# Patient Record
Sex: Female | Born: 1985 | Race: White | Hispanic: No | Marital: Married | Smoking: Never smoker
Health system: Southern US, Community
[De-identification: ages and names within clinical notes are randomized; demographics above are authoritative.]

## PROBLEM LIST (undated history)

## (undated) HISTORY — PX: APPENDECTOMY: SHX54

---

## 2018-04-22 ENCOUNTER — Other Ambulatory Visit: Payer: Self-pay | Admitting: General Surgery

## 2018-04-22 ENCOUNTER — Ambulatory Visit
Admission: RE | Admit: 2018-04-22 | Discharge: 2018-04-22 | Disposition: A | Discharge status: Home / Self Care | Payer: Self-pay | Source: Ambulatory Visit | Attending: General Surgery | Admitting: General Surgery

## 2018-04-22 ENCOUNTER — Other Ambulatory Visit: Payer: Self-pay | Admitting: Obstetrics and Gynecology

## 2018-04-22 ENCOUNTER — Other Ambulatory Visit: Payer: Self-pay

## 2018-04-22 DIAGNOSIS — N63 Unspecified lump in unspecified breast: Secondary | ICD-10-CM

## 2018-04-23 ENCOUNTER — Other Ambulatory Visit: Payer: No Typology Code available for payment source

## 2018-04-24 ENCOUNTER — Ambulatory Visit
Admission: RE | Admit: 2018-04-24 | Discharge: 2018-04-24 | Disposition: A | Discharge status: Home / Self Care | Payer: Self-pay | Source: Ambulatory Visit | Attending: Obstetrics and Gynecology | Admitting: Obstetrics and Gynecology

## 2018-04-24 ENCOUNTER — Encounter (HOSPITAL_COMMUNITY): Payer: Self-pay

## 2018-04-24 ENCOUNTER — Other Ambulatory Visit: Payer: Self-pay | Admitting: Obstetrics and Gynecology

## 2018-04-24 ENCOUNTER — Ambulatory Visit (HOSPITAL_COMMUNITY)
Admission: RE | Admit: 2018-04-24 | Discharge: 2018-04-24 | Disposition: A | Discharge status: Home / Self Care | Payer: Self-pay | Source: Ambulatory Visit | Attending: Obstetrics and Gynecology | Admitting: Obstetrics and Gynecology

## 2018-04-24 ENCOUNTER — Other Ambulatory Visit (HOSPITAL_COMMUNITY): Payer: Self-pay | Admitting: *Deleted

## 2018-04-24 VITALS — BP 110/68 | Wt 129.0 lb

## 2018-04-24 DIAGNOSIS — Z1239 Encounter for other screening for malignant neoplasm of breast: Secondary | ICD-10-CM

## 2018-04-24 DIAGNOSIS — N6315 Unspecified lump in the right breast, overlapping quadrants: Secondary | ICD-10-CM

## 2018-04-24 DIAGNOSIS — N63 Unspecified lump in unspecified breast: Secondary | ICD-10-CM

## 2018-04-24 DIAGNOSIS — N6320 Unspecified lump in the left breast, unspecified quadrant: Secondary | ICD-10-CM

## 2018-04-24 DIAGNOSIS — N6310 Unspecified lump in the right breast, unspecified quadrant: Secondary | ICD-10-CM

## 2018-04-24 NOTE — Progress Notes (Addendum)
Complaints of a left breast lump since December 13, 2017 that has increased in size, reddened, and painful. Patient stated she has been treated with four different antibiotics.   Pap Smear: Pap smear not completed today. Last Pap smear was in June 2018 at [facility] and normal per patient. Per patient has no history of an abnormal Pap smear. No Pap smear results are in Epic.  Physical exam: Breasts Breasts symmetrical. No skin abnormalities right breast. Skin is bruised within the left breast between 9 o'clock and 1 o'clock. A reddened area observed on the right breast between 8 o'clock and 9 o'clock. No nipple retraction bilateral breasts. No nipple discharge bilateral breasts. No lymphadenopathy. Palpated a lump within the left breast between 8 o'clock and 1 o'clock. Palpated a pea sized lump within the right breast at 12 o'clock 4 cm from the nipple. Complaints of pain when palpated the lump within the left breast. Referred patient to the [facility] of [facility] for a diagnostic mammogram and bilateral breast ultrasound. Appointment scheduled for Thursday, April 24, 2018 at 1100.        Pelvic/Bimanual No Pap smear completed today since last Pap smear was in June 2018 per patient. Pap smear not indicated per [facility] guidelines.   Smoking History: Patient has never smoked.  Patient Navigation: Patient education provided. Access to services provided for patient through [facility] program.   Breast and Cervical Cancer Risk Assessment: Patient has no family history of breast cancer, known genetic mutations, or radiation treatment to the chest before age 7. Patient has no history of cervical dysplasia, immunocompromised, or DES exposure in-utero. Breast Cancer risk assessment completed. No breast cancer risk calculated due to patient is less than 42 years old.

## 2018-04-24 NOTE — Patient Instructions (Signed)
Explained breast self awareness with Karen Morse. Patient did not need a Pap smear today due to last Pap smear was in 2018 per patient. Let her know [facility] will cover Pap smears every 3 years unless has a history of abnormal Pap smears. Referred patient to the [facility] of [facility] for a diagnostic mammogram and bilateral breast ultrasound. Appointment scheduled for Thursday, April 24, 2018 at 1100. Patient aware of appointment and will be there. Karen Morse verbalized understanding.  Debora Stockdale, Kathaleen Maser, RN 12:36 PM

## 2018-04-25 ENCOUNTER — Encounter (HOSPITAL_COMMUNITY): Payer: Self-pay | Admitting: *Deleted

## 2018-04-26 LAB — ACID FAST SMEAR (AFB, MYCOBACTERIA): Acid Fast Smear: NEGATIVE

## 2018-04-26 LAB — ACID FAST SMEAR (AFB)

## 2018-04-29 LAB — AEROBIC/ANAEROBIC CULTURE W GRAM STAIN (SURGICAL/DEEP WOUND)
Culture: NO GROWTH
Special Requests: NORMAL

## 2018-05-28 LAB — FUNGAL ORGANISM REFLEX

## 2018-05-28 LAB — FUNGUS CULTURE WITH STAIN

## 2018-05-28 LAB — FUNGUS CULTURE RESULT

## 2018-06-03 ENCOUNTER — Telehealth (HOSPITAL_COMMUNITY): Payer: Self-pay | Admitting: *Deleted

## 2018-06-03 NOTE — Telephone Encounter (Signed)
Patient called about receiving bills. Patient is going to mail copy of bills to [employer].

## 2018-06-08 LAB — ACID FAST CULTURE WITH REFLEXED SENSITIVITIES (MYCOBACTERIA)

## 2018-06-08 LAB — ACID FAST CULTURE WITH REFLEXED SENSITIVITIES: ACID FAST CULTURE - AFSCU3: NEGATIVE

## 2018-10-06 ENCOUNTER — Other Ambulatory Visit: Payer: Self-pay | Admitting: Surgery

## 2018-10-06 DIAGNOSIS — N61 Mastitis without abscess: Secondary | ICD-10-CM

## 2020-09-22 IMAGING — US ULTRASOUND LEFT BREAST LIMITED
1 series · 12 of 12 positions shown · non-contrast
Comparison: Previous exam(s).

CLINICAL DATA: Patient with LEFT breast abscess collection
identified at an outside institution in Wednesday January, 2018, with
presents today for additional diagnostic evaluation and
ultrasound-guided biopsy to ensure benignity.

Additional lump within upper outer quadrant of the RIGHT breast.
EXAM:
DIGITAL DIAGNOSTIC BILATERAL MAMMOGRAM WITH CAD AND TOMO
ULTRASOUND BILATERAL BREAST

[Series 1: ultrasound left breast limited · 0.07mm/px · 12 of 12 slices shown]
[im 1/12]
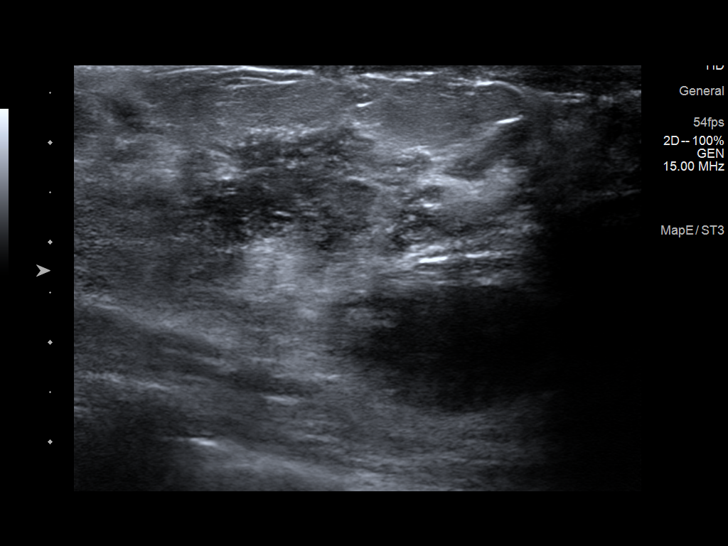
[im 2/12]
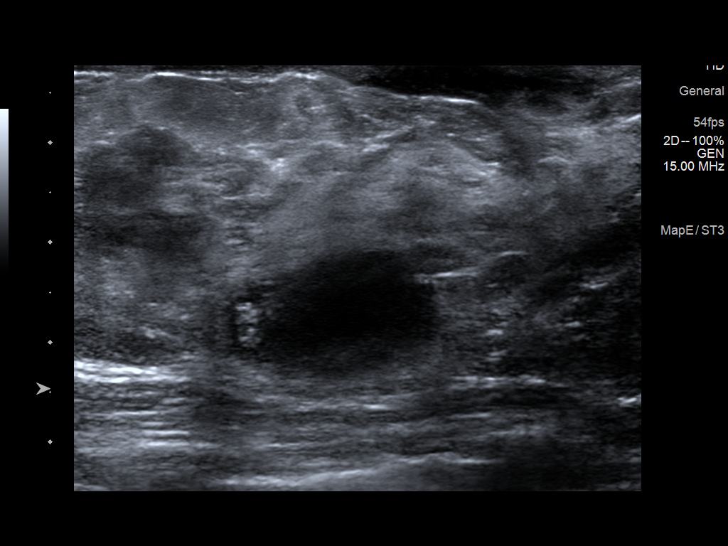
[im 3/12]
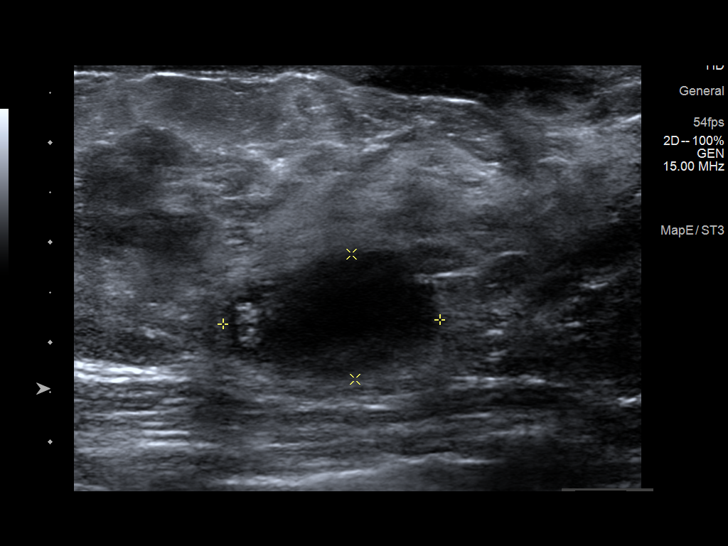
[im 4/12]
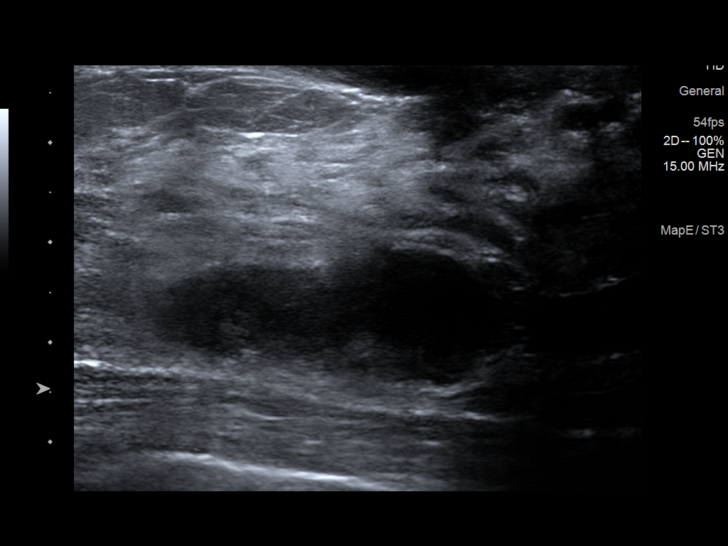
[im 5/12]
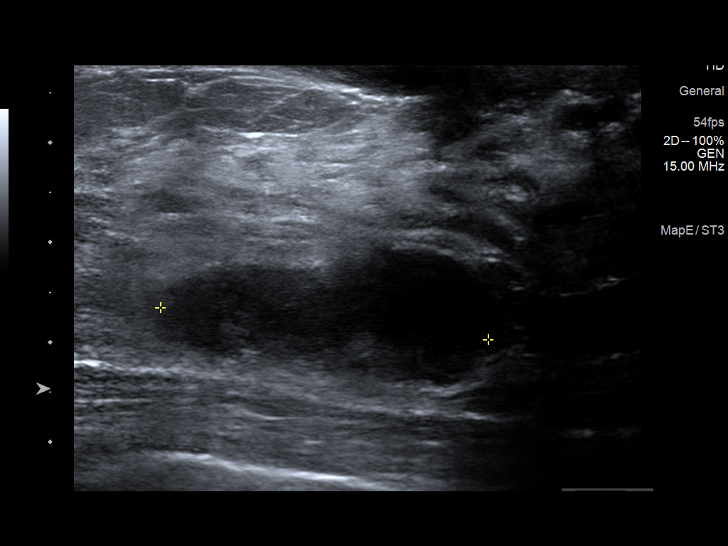
[im 6/12]
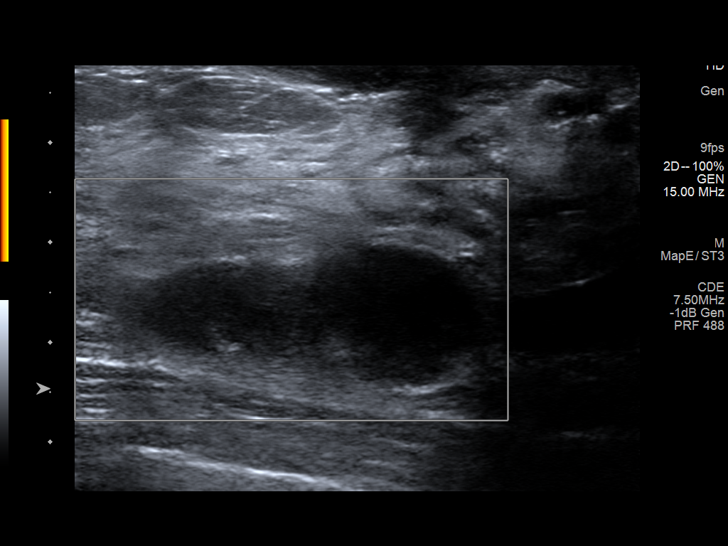
[im 7/12]
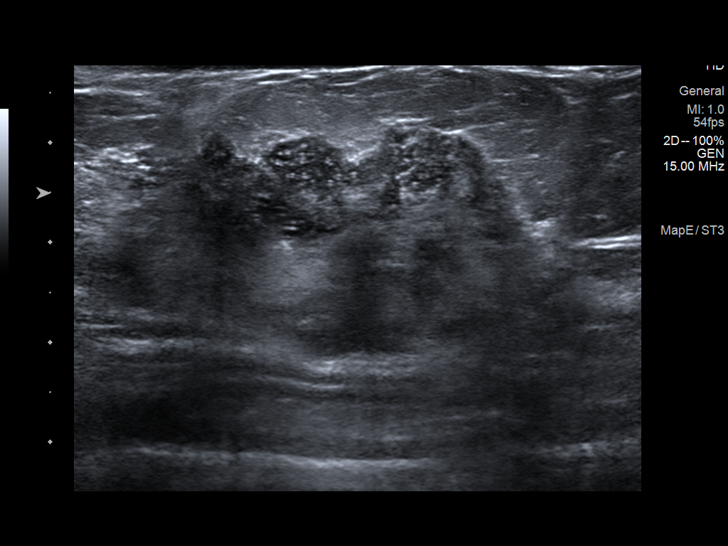
[im 8/12]
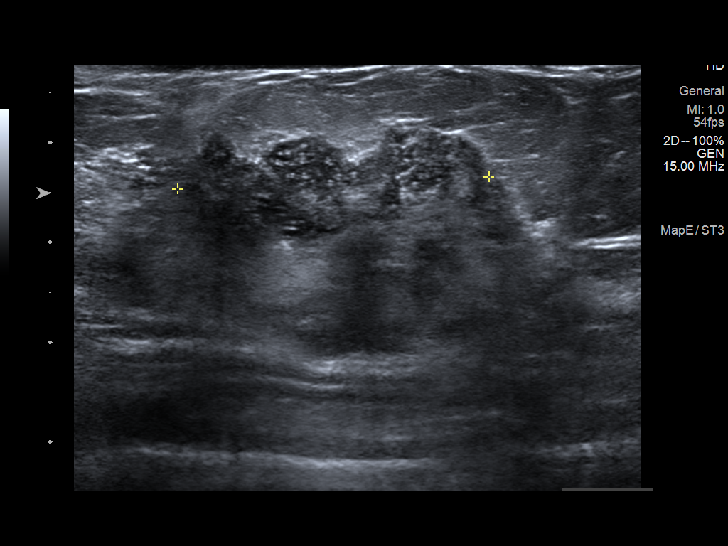
[im 9/12]
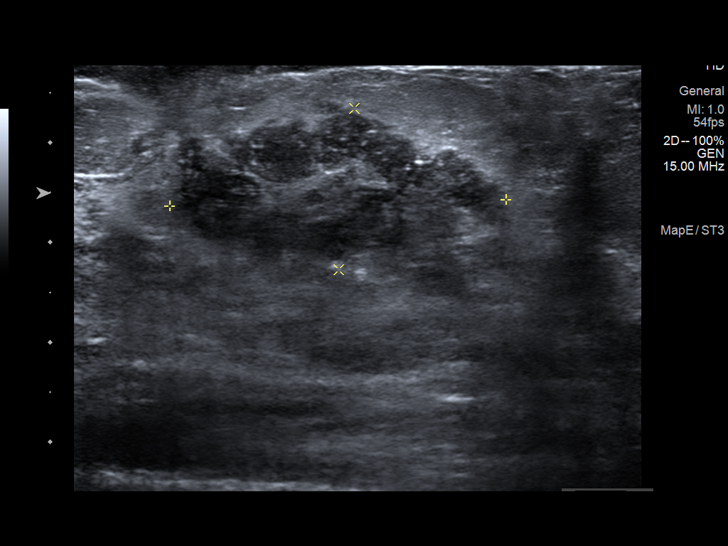
[im 10/12]
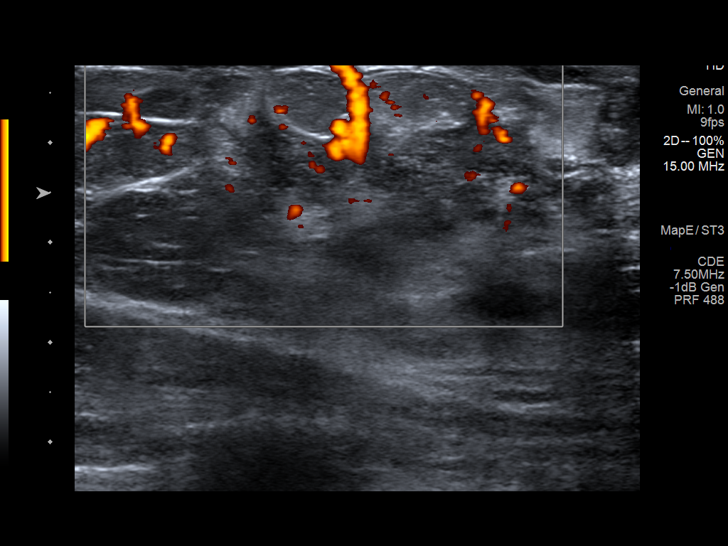
[im 11/12]
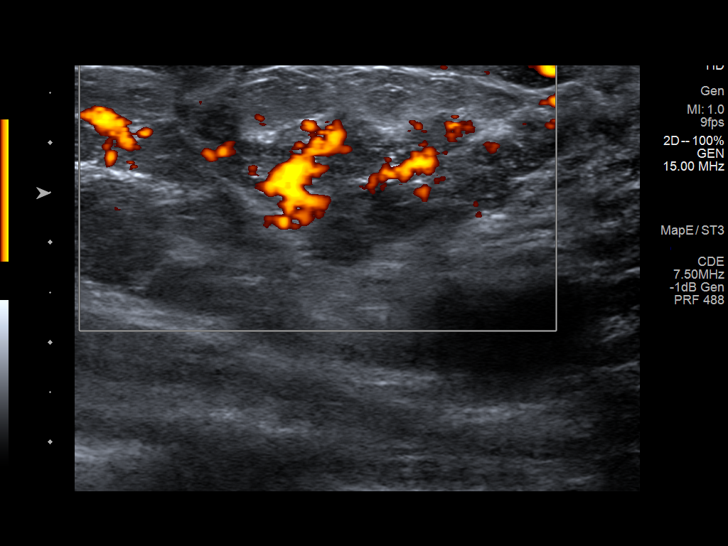
[im 12/12]
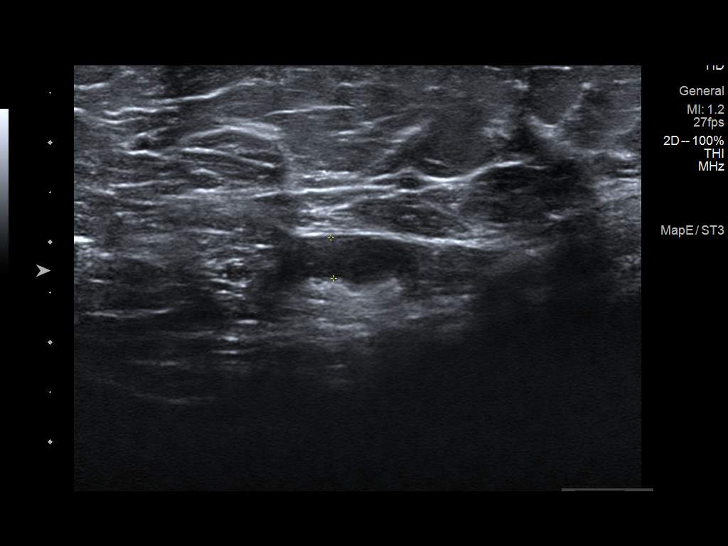

[12 of 12 positions shown; findings below may reference images not displayed]

ACR Breast Density Category c: The breast tissue is heterogeneously
dense, which may obscure small masses.
FINDINGS: RIGHT breast: There is a partially obscured low-density mass within
the upper outer quadrant of the RIGHT breast, measuring
approximately 1.3 cm greatest dimension.

No additional masses, suspicious calcifications or secondary signs
of malignancy within the RIGHT breast.

LEFT breast: There is an irregular mass within the slightly inner
LEFT breast, at posterior depth, measuring approximately 4 cm
greatest dimension, corresponding to the area of clinical concern
with overlying skin marker in place.

Questionable additional low-density mass within the retroareolar
LEFT breast, at superficial depth, measuring approximately 2 cm
greatest dimension, also corresponding to the area of clinical
concern with overlying skin marker in place.

Mammographic images were processed with CAD.

RIGHT breast: Targeted ultrasound is performed, showing an oval
circumscribed hypoechoic mass at the 11 o'clock axis, 4 cm from
nipple, measuring 1.3 x 0.6 x 1.2 cm, most likely a benign
fibroadenoma.

LEFT breast: There is a complex fluid collection within the LEFT
breast at the 9 o'clock axis, retroareolar, at posterior depth,
avascular, measuring 3.3 x 1.3 x 2.2 cm.

There is an additional ill-defined hypoechoic area within the LEFT
breast at the 11:30 o'clock axis, 3 cm from nipple, measuring 3.4 x
1.6 x 3.1 cm, hypervascular, corresponding to the site of a palpable
lump and overlying skin changes/recent drainage.
IMPRESSION: 1. Complex fluid collection within the LEFT breast at the 9 o'clock
axis, retroareolar, at posterior depth, measuring 3.3 cm, presumed
abscess collection. Ultrasound-guided drainage will be attempted
later today.
2. Ill-defined hypoechoic area within the LEFT breast at the 11:30
o'clock axis, 3 cm from nipple, measuring 3.4 cm greatest extent.
Ultrasound-guided biopsy will be performed later today. This may
represent granulomatous mastitis. The complex fluid collection at
posterior depth may represent superimposed abscess.
3. Oval circumscribed hypoechoic mass within the RIGHT breast at the
11 o'clock axis, 4 cm from the nipple, measuring 1.3 cm, most
suggestive of a benign fibroadenoma. Recommend follow-up ultrasound
in 6 months to ensure stability.

RECOMMENDATION:
1. Ultrasound-guided drainage will be attempted later today for the
presumed abscess collection within the LEFT breast at the 9 o'clock
axis, retroareolar, at posterior depth, measuring 3.3 cm.
2. Ultrasound-guided biopsy will be performed later today for the
hypoechoic area within the LEFT breast at the 11:30 o'clock axis,
measuring 3.4 cm greatest extent. This is suspected infection,
possible granulomatous mastitis.
3. If biopsy results are benign, recommend follow-up RIGHT breast
ultrasound in 6 months to ensure stability of the probably benign
fibroadenoma in the RIGHT breast at the 11 o'clock axis.

I have discussed the findings and recommendations with the patient.
Results were also provided in writing at the conclusion of the
visit. If applicable, a reminder letter will be sent to the patient
regarding the next appointment.

BI-RADS CATEGORY  3: Probably benign.

## 2020-09-22 IMAGING — MG DIGITAL DIAGNOSTIC BILATERAL MAMMOGRAM WITH TOMO AND CAD
6 of 12 series · 6 of 36 positions shown · non-contrast
Comparison: Previous exam(s).

CLINICAL DATA: Patient with LEFT breast abscess collection
identified at an outside institution in Wednesday January, 2018, with
presents today for additional diagnostic evaluation and
ultrasound-guided biopsy to ensure benignity.

Additional lump within upper outer quadrant of the RIGHT breast.
EXAM:
DIGITAL DIAGNOSTIC BILATERAL MAMMOGRAM WITH CAD AND TOMO
ULTRASOUND BILATERAL BREAST

[L MLO synth-2D (1 of 2)]
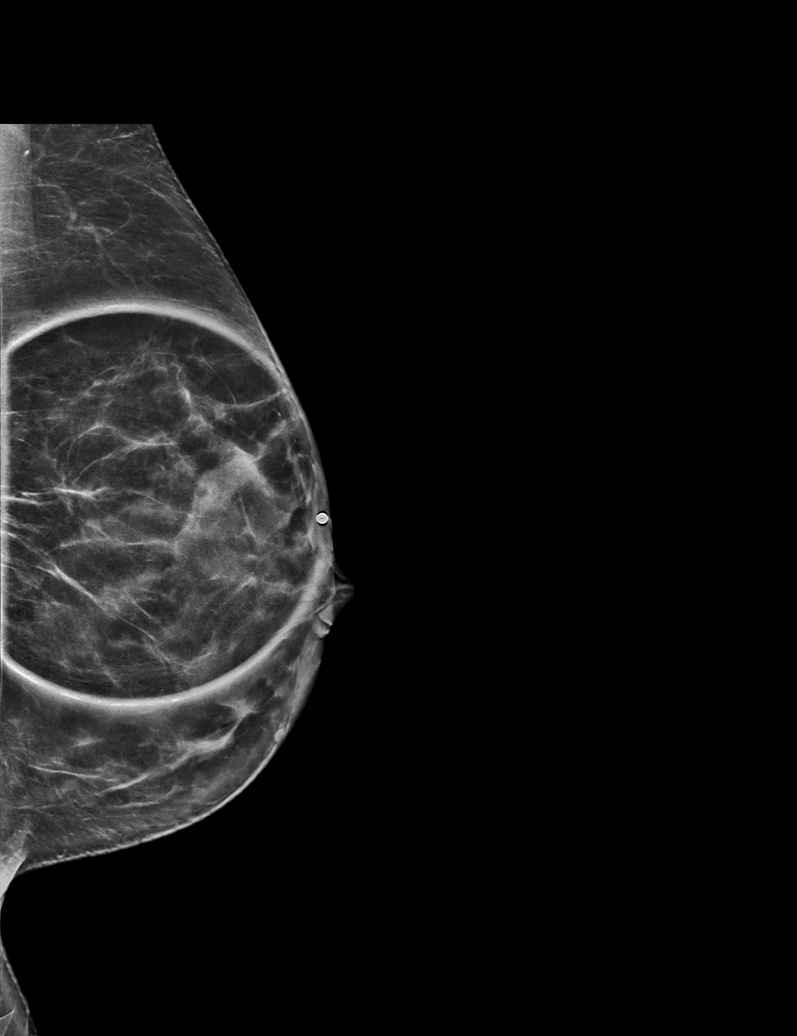

[L MLO synth-2D (2 of 2)]
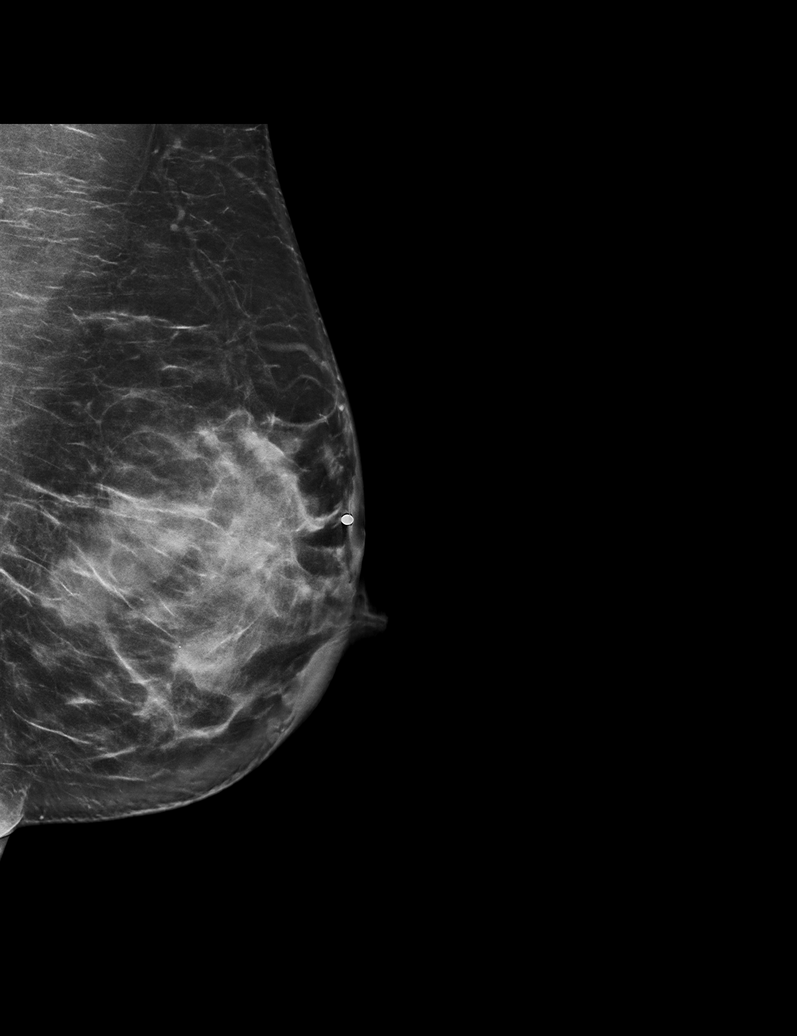

[L CC synth-2D]
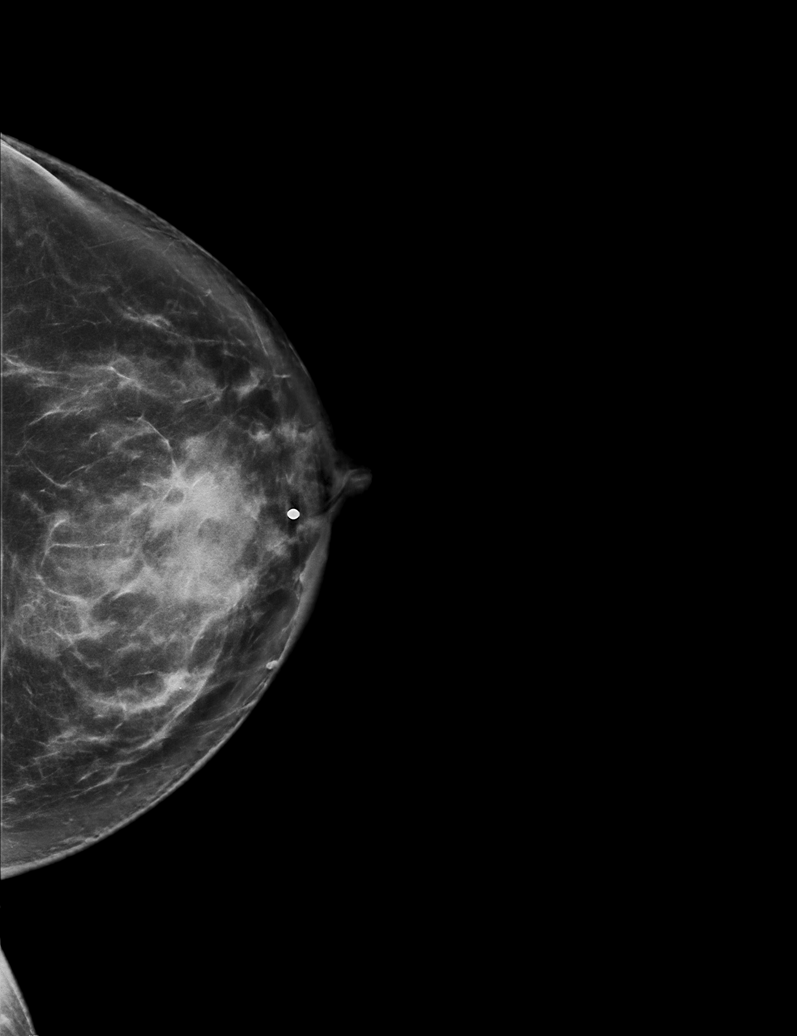

[R MLO synth-2D (1 of 2)]
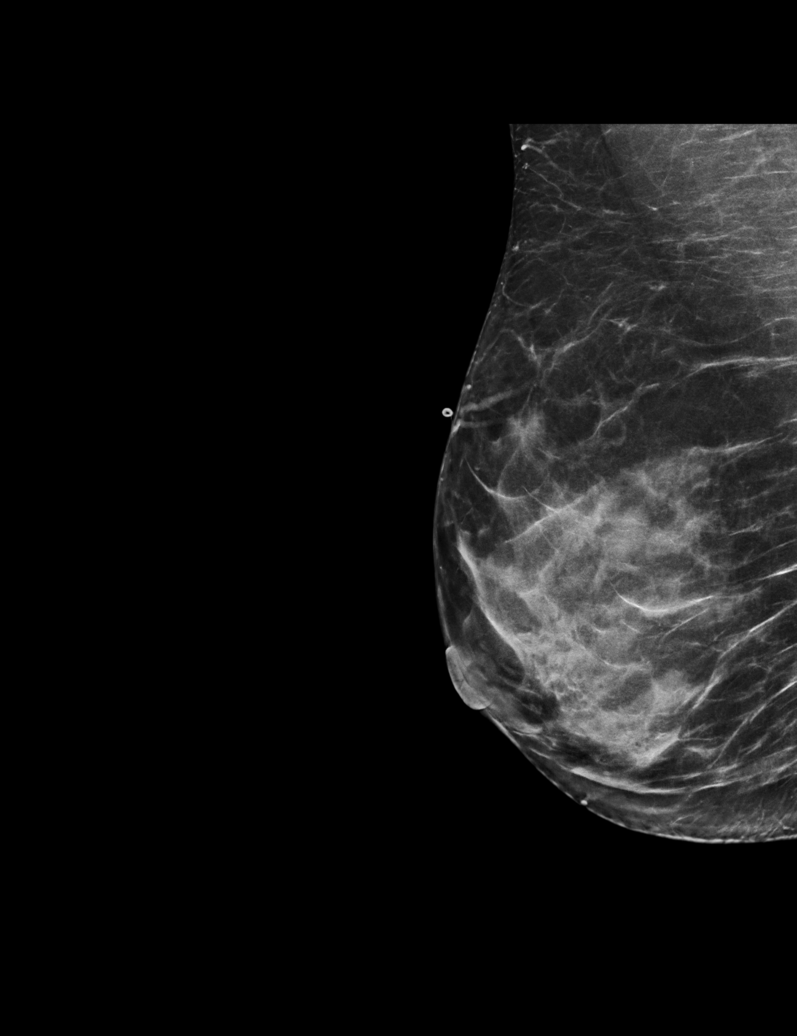

[R CC synth-2D]
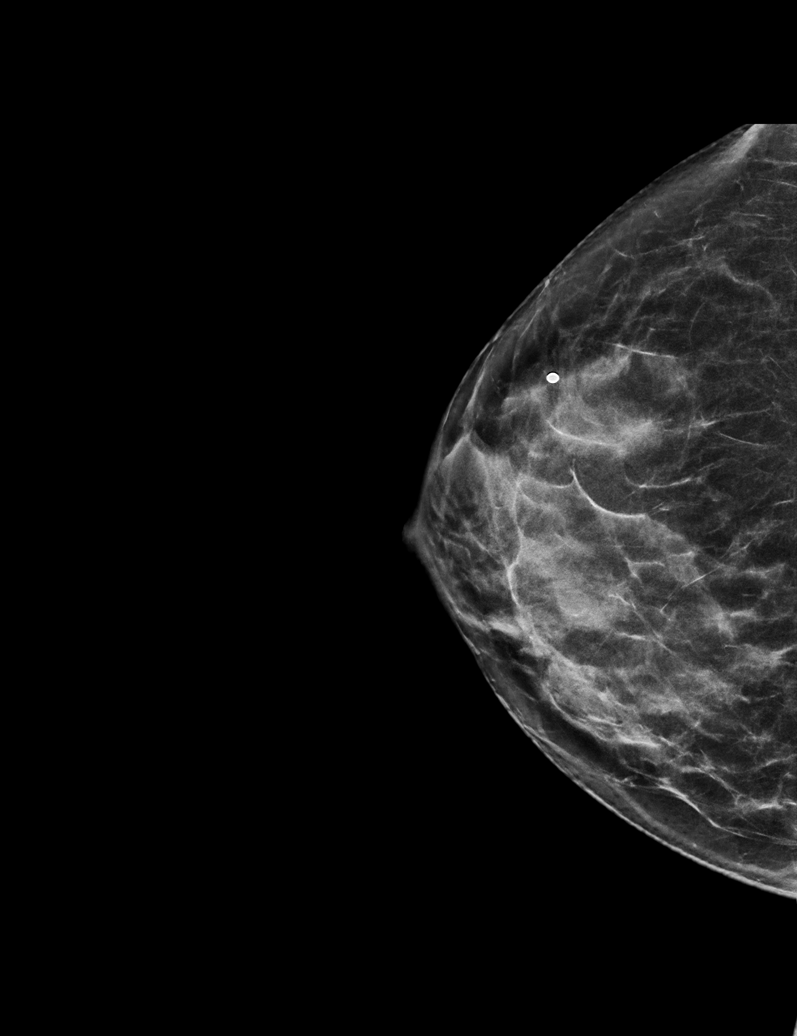

[R MLO synth-2D (2 of 2)]
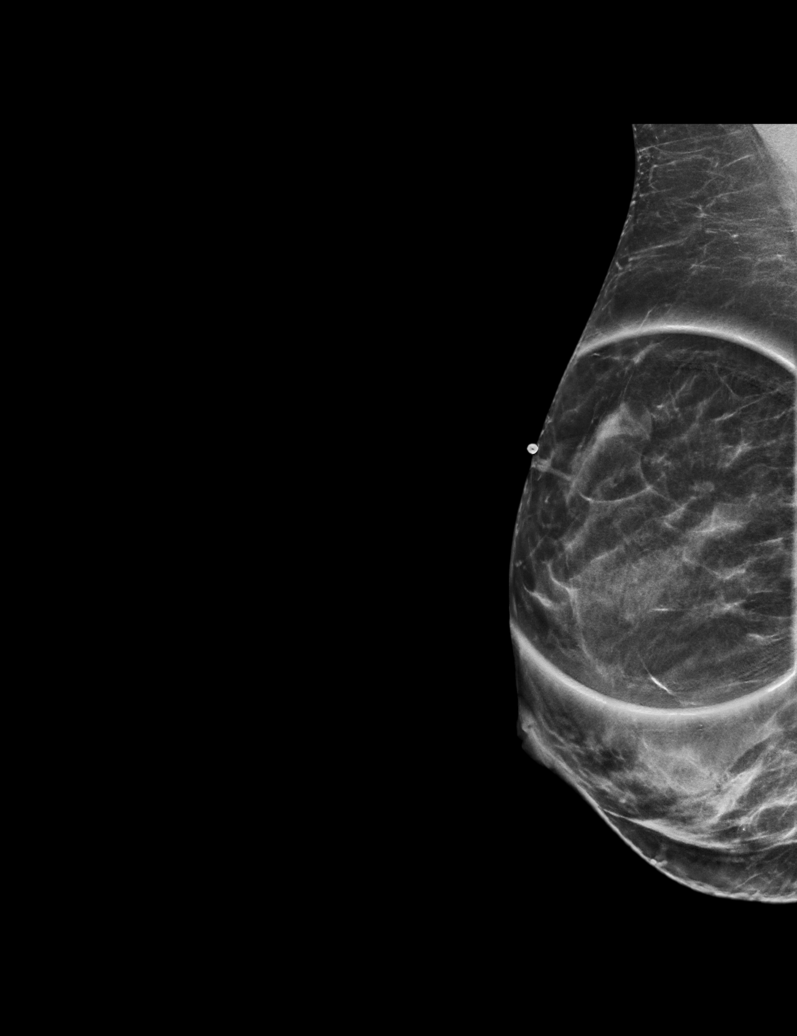

[6 of 36 positions shown; findings below may reference images not displayed]

ACR Breast Density Category c: The breast tissue is heterogeneously
dense, which may obscure small masses.
FINDINGS: RIGHT breast: There is a partially obscured low-density mass within
the upper outer quadrant of the RIGHT breast, measuring
approximately 1.3 cm greatest dimension.

No additional masses, suspicious calcifications or secondary signs
of malignancy within the RIGHT breast.

LEFT breast: There is an irregular mass within the slightly inner
LEFT breast, at posterior depth, measuring approximately 4 cm
greatest dimension, corresponding to the area of clinical concern
with overlying skin marker in place.

Questionable additional low-density mass within the retroareolar
LEFT breast, at superficial depth, measuring approximately 2 cm
greatest dimension, also corresponding to the area of clinical
concern with overlying skin marker in place.

Mammographic images were processed with CAD.

RIGHT breast: Targeted ultrasound is performed, showing an oval
circumscribed hypoechoic mass at the 11 o'clock axis, 4 cm from
nipple, measuring 1.3 x 0.6 x 1.2 cm, most likely a benign
fibroadenoma.

LEFT breast: There is a complex fluid collection within the LEFT
breast at the 9 o'clock axis, retroareolar, at posterior depth,
avascular, measuring 3.3 x 1.3 x 2.2 cm.

There is an additional ill-defined hypoechoic area within the LEFT
breast at the 11:30 o'clock axis, 3 cm from nipple, measuring 3.4 x
1.6 x 3.1 cm, hypervascular, corresponding to the site of a palpable
lump and overlying skin changes/recent drainage.
IMPRESSION: 1. Complex fluid collection within the LEFT breast at the 9 o'clock
axis, retroareolar, at posterior depth, measuring 3.3 cm, presumed
abscess collection. Ultrasound-guided drainage will be attempted
later today.
2. Ill-defined hypoechoic area within the LEFT breast at the 11:30
o'clock axis, 3 cm from nipple, measuring 3.4 cm greatest extent.
Ultrasound-guided biopsy will be performed later today. This may
represent granulomatous mastitis. The complex fluid collection at
posterior depth may represent superimposed abscess.
3. Oval circumscribed hypoechoic mass within the RIGHT breast at the
11 o'clock axis, 4 cm from the nipple, measuring 1.3 cm, most
suggestive of a benign fibroadenoma. Recommend follow-up ultrasound
in 6 months to ensure stability.

RECOMMENDATION:
1. Ultrasound-guided drainage will be attempted later today for the
presumed abscess collection within the LEFT breast at the 9 o'clock
axis, retroareolar, at posterior depth, measuring 3.3 cm.
2. Ultrasound-guided biopsy will be performed later today for the
hypoechoic area within the LEFT breast at the 11:30 o'clock axis,
measuring 3.4 cm greatest extent. This is suspected infection,
possible granulomatous mastitis.
3. If biopsy results are benign, recommend follow-up RIGHT breast
ultrasound in 6 months to ensure stability of the probably benign
fibroadenoma in the RIGHT breast at the 11 o'clock axis.

I have discussed the findings and recommendations with the patient.
Results were also provided in writing at the conclusion of the
visit. If applicable, a reminder letter will be sent to the patient
regarding the next appointment.

BI-RADS CATEGORY  3: Probably benign.
